# Patient Record
Sex: Female | Born: 1980
Health system: Southern US, Community
[De-identification: ages and names within clinical notes are randomized; demographics above are authoritative.]

## PROBLEM LIST (undated history)

## (undated) ENCOUNTER — Emergency Department (HOSPITAL_COMMUNITY): Admission: EM | Payer: 59 | Source: Home / Self Care

## (undated) DIAGNOSIS — Z8489 Family history of other specified conditions: Secondary | ICD-10-CM

## (undated) DIAGNOSIS — C181 Malignant neoplasm of appendix: Secondary | ICD-10-CM

## (undated) DIAGNOSIS — K219 Gastro-esophageal reflux disease without esophagitis: Secondary | ICD-10-CM

## (undated) DIAGNOSIS — F3281 Premenstrual dysphoric disorder: Secondary | ICD-10-CM

## (undated) DIAGNOSIS — T8859XA Other complications of anesthesia, initial encounter: Secondary | ICD-10-CM

## (undated) DIAGNOSIS — Z9889 Other specified postprocedural states: Secondary | ICD-10-CM

## (undated) DIAGNOSIS — K8020 Calculus of gallbladder without cholecystitis without obstruction: Secondary | ICD-10-CM

## (undated) DIAGNOSIS — E282 Polycystic ovarian syndrome: Secondary | ICD-10-CM

## (undated) DIAGNOSIS — S0300XA Dislocation of jaw, unspecified side, initial encounter: Secondary | ICD-10-CM

## (undated) HISTORY — PX: SHOULDER SURGERY: SHX246

## (undated) HISTORY — PX: ABDOMINOPLASTY: SUR9

## (undated) HISTORY — DX: Calculus of gallbladder without cholecystitis without obstruction: K80.20

## (undated) HISTORY — PX: APPENDECTOMY: SHX54

## (undated) HISTORY — PX: NASAL RECONSTRUCTION: SHX2069

## (undated) HISTORY — PX: CHOLECYSTECTOMY: SHX55

## (undated) HISTORY — DX: Premenstrual dysphoric disorder: F32.81

## (undated) HISTORY — DX: Dislocation of jaw, unspecified side, initial encounter: S03.00XA

## (undated) HISTORY — DX: Malignant neoplasm of appendix: C18.1

---

## 2009-05-01 ENCOUNTER — Inpatient Hospital Stay (HOSPITAL_COMMUNITY): Admission: AD | Admit: 2009-05-01 | Discharge: 2009-05-02 | Payer: Self-pay | Admitting: Pediatrics

## 2009-05-01 ENCOUNTER — Ambulatory Visit: Payer: Self-pay | Admitting: Advanced Practice Midwife

## 2010-05-04 ENCOUNTER — Other Ambulatory Visit (HOSPITAL_COMMUNITY): Payer: BC Managed Care – PPO

## 2010-05-04 ENCOUNTER — Inpatient Hospital Stay (HOSPITAL_COMMUNITY)
Admission: AD | Admit: 2010-05-04 | Discharge: 2010-05-04 | Disposition: A | Discharge status: Home / Self Care | Payer: BC Managed Care – PPO | Source: Ambulatory Visit | Attending: Obstetrics and Gynecology | Admitting: Obstetrics and Gynecology

## 2010-05-04 DIAGNOSIS — O039 Complete or unspecified spontaneous abortion without complication: Secondary | ICD-10-CM | POA: Insufficient documentation

## 2010-05-04 LAB — URINALYSIS, ROUTINE W REFLEX MICROSCOPIC
Bilirubin Urine: NEGATIVE
Ketones, ur: NEGATIVE mg/dL
Leukocytes, UA: NEGATIVE
Nitrite: NEGATIVE
Protein, ur: NEGATIVE mg/dL
Urobilinogen, UA: 0.2 mg/dL (ref 0.0–1.0)
pH: 6.5 (ref 5.0–8.0)

## 2010-05-04 LAB — CBC
MCH: 30.8 pg (ref 26.0–34.0)
RBC: 4.06 MIL/uL (ref 3.87–5.11)
WBC: 10.7 10*3/uL — ABNORMAL HIGH (ref 4.0–10.5)

## 2010-05-04 LAB — POCT PREGNANCY, URINE: Preg Test, Ur: POSITIVE

## 2010-05-04 LAB — WET PREP, GENITAL: Clue Cells Wet Prep HPF POC: NONE SEEN

## 2010-05-08 LAB — GC/CHLAMYDIA PROBE AMP, GENITAL
Chlamydia, DNA Probe: NEGATIVE
GC Probe Amp, Genital: NEGATIVE

## 2010-05-21 ENCOUNTER — Encounter: Payer: BC Managed Care – PPO | Admitting: Obstetrics & Gynecology

## 2010-05-24 DEATH — deceased

## 2010-06-14 LAB — URINALYSIS, ROUTINE W REFLEX MICROSCOPIC
Hgb urine dipstick: NEGATIVE
Ketones, ur: 40 mg/dL — AB
Specific Gravity, Urine: >1.03 — ABNORMAL HIGH (ref 1.005–1.030)

## 2010-06-14 LAB — URINE MICROSCOPIC-ADD ON

## 2010-06-14 LAB — POCT PREGNANCY, URINE: Preg Test, Ur: POSITIVE

## 2012-08-18 ENCOUNTER — Encounter: Payer: Self-pay | Admitting: Cardiovascular Disease

## 2012-08-18 ENCOUNTER — Ambulatory Visit (INDEPENDENT_AMBULATORY_CARE_PROVIDER_SITE_OTHER): Payer: BC Managed Care – PPO | Admitting: Cardiovascular Disease

## 2012-08-18 VITALS — BP 110/82 | HR 63 | Ht 62.0 in | Wt 170.0 lb

## 2012-08-18 DIAGNOSIS — R079 Chest pain, unspecified: Secondary | ICD-10-CM | POA: Insufficient documentation

## 2012-08-18 DIAGNOSIS — E663 Overweight: Secondary | ICD-10-CM | POA: Insufficient documentation

## 2012-08-18 DIAGNOSIS — I498 Other specified cardiac arrhythmias: Secondary | ICD-10-CM

## 2012-08-18 DIAGNOSIS — R001 Bradycardia, unspecified: Secondary | ICD-10-CM | POA: Insufficient documentation

## 2012-08-18 NOTE — Patient Instructions (Addendum)
Your physician has requested that you have a stress echocardiogram. For further information please visit [email or web address] Please follow instruction sheet as given.  Your physician recommends that you continue on your current medications as directed. Please refer to the Current Medication list given to you today.  Your physician recommends that you schedule a follow-up appointment in: as needed. We will contact you with your test results

## 2012-08-18 NOTE — Assessment & Plan Note (Signed)
Atypical Normal exam and ECG F/U stress echo

## 2012-08-18 NOTE — Assessment & Plan Note (Signed)
No clinically significant low HR Normla ECG  Should have normal HR response on ETT

## 2012-08-18 NOTE — Assessment & Plan Note (Signed)
Encouraged her not to use diet pills Can do a lot with diet and exercise.  Not heavy enough for bariatric considerations

## 2012-08-18 NOTE — Progress Notes (Signed)
Patient ID: Mallory Byrd, female   DOB: 07-13-1980, 32 y.o.   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  Was having an ECG to use diet pills.  Told she was bradycardic.  Reviewed and normal ECG SR rate 51 no AV block.  She has been overweight for a long time Poor high carb diet and sedentary outside of work.  Chronic sharp sternal / chostrocondral pain.  Has been on diet pills in past with no issues She cannot recall name. No palpitations syncope lightheadedness. No family history of sudden death.  ROS: Denies fever, malais, weight loss, blurry vision, decreased visual acuity, cough, sputum, SOB, hemoptysis, pleuritic pain, palpitaitons, heartburn, abdominal pain, melena, lower extremity edema, claudication, or rash.  All other systems reviewed and negative   General: Affect appropriate Healthy:  appears stated age HEENT: normal Neck supple with no adenopathy JVP normal no bruits no thyromegaly Lungs clear with no wheezing and good diaphragmatic motion Heart:  S1/S2 no murmur,rub, gallop or click PMI normal Abdomen: benighn, BS positve, no tenderness, no AAA no bruit.  No HSM or HJR Distal pulses intact with no bruits No edema Neuro non-focal Skin warm and dry No muscular weakness  Medications No current outpatient prescriptions on file.   No current facility-administered medications for this visit.    Allergies Review of patient's allergies indicates not on file.  Family History: No family history on file.  Social History: History   Social History  . Marital Status: Married    Spouse Name: N/A    Number of Children: N/A  . Years of Education: N/A   Occupational History  . Not on file.   Social History Main Topics  . Smoking status: Not on file  . Smokeless tobacco: Not on file  . Alcohol Use: Not on file  . Drug Use: Not on file  . Sexually Active: Not on file   Other Topics Concern  . Not on file   Social History Narrative  . No narrative on file     Electrocardiogram:  SR rate 63 normal ECG   Assessment and Plan

## 2012-09-11 ENCOUNTER — Other Ambulatory Visit (HOSPITAL_COMMUNITY): Payer: BC Managed Care – PPO

## 2012-09-15 ENCOUNTER — Encounter: Payer: Self-pay | Admitting: Cardiovascular Disease

## 2015-03-26 HISTORY — PX: RHINOPLASTY: SUR1284

## 2016-01-26 ENCOUNTER — Ambulatory Visit (INDEPENDENT_AMBULATORY_CARE_PROVIDER_SITE_OTHER): Payer: Self-pay | Admitting: Orthopedic Surgery

## 2017-03-05 DIAGNOSIS — Z9889 Other specified postprocedural states: Secondary | ICD-10-CM | POA: Insufficient documentation

## 2017-03-25 DIAGNOSIS — C181 Malignant neoplasm of appendix: Secondary | ICD-10-CM

## 2017-03-25 DIAGNOSIS — K8020 Calculus of gallbladder without cholecystitis without obstruction: Secondary | ICD-10-CM

## 2017-03-25 HISTORY — DX: Calculus of gallbladder without cholecystitis without obstruction: K80.20

## 2017-03-25 HISTORY — PX: APPENDECTOMY: SHX54

## 2017-03-25 HISTORY — DX: Malignant neoplasm of appendix: C18.1

## 2017-03-25 HISTORY — PX: CHOLECYSTECTOMY: SHX55

## 2018-11-10 DIAGNOSIS — E282 Polycystic ovarian syndrome: Secondary | ICD-10-CM | POA: Insufficient documentation

## 2020-03-25 DIAGNOSIS — R200 Anesthesia of skin: Secondary | ICD-10-CM

## 2020-03-25 HISTORY — PX: ABDOMINOPLASTY: SUR9

## 2020-03-25 HISTORY — DX: Anesthesia of skin: R20.0

## 2021-02-18 ENCOUNTER — Other Ambulatory Visit: Payer: Self-pay

## 2021-02-18 ENCOUNTER — Emergency Department (HOSPITAL_COMMUNITY): Payer: 59

## 2021-02-18 ENCOUNTER — Emergency Department (HOSPITAL_BASED_OUTPATIENT_CLINIC_OR_DEPARTMENT_OTHER)
Admission: EM | Admit: 2021-02-18 | Discharge: 2021-02-18 | Disposition: A | Discharge status: Home / Self Care | Payer: 59 | Attending: Emergency Medicine | Admitting: Emergency Medicine

## 2021-02-18 ENCOUNTER — Encounter (HOSPITAL_BASED_OUTPATIENT_CLINIC_OR_DEPARTMENT_OTHER): Payer: Self-pay | Admitting: Emergency Medicine

## 2021-02-18 ENCOUNTER — Emergency Department (HOSPITAL_BASED_OUTPATIENT_CLINIC_OR_DEPARTMENT_OTHER): Payer: 59

## 2021-02-18 DIAGNOSIS — R202 Paresthesia of skin: Secondary | ICD-10-CM | POA: Diagnosis not present

## 2021-02-18 DIAGNOSIS — R200 Anesthesia of skin: Secondary | ICD-10-CM

## 2021-02-18 LAB — BASIC METABOLIC PANEL
Anion gap: 8 (ref 5–15)
BUN: 17 mg/dL (ref 6–20)
CO2: 24 mmol/L (ref 22–32)
Calcium: 9.2 mg/dL (ref 8.9–10.3)
Chloride: 105 mmol/L (ref 98–111)
Creatinine, Ser: 0.96 mg/dL (ref 0.44–1.00)
GFR, Estimated: >60 mL/min (ref >60)
Glucose, Bld: 92 mg/dL (ref 70–99)
Potassium: 3.7 mmol/L (ref 3.5–5.1)
Sodium: 137 mmol/L (ref 135–145)

## 2021-02-18 LAB — CBC
HCT: 41.1 % (ref 36.0–46.0)
Hemoglobin: 13.3 g/dL (ref 12.0–15.0)
MCH: 31.3 pg (ref 26.0–34.0)
MCHC: 32.4 g/dL (ref 30.0–36.0)
MCV: 96.7 fL (ref 80.0–100.0)
Platelets: 327 10*3/uL (ref 150–400)
RBC: 4.25 MIL/uL (ref 3.87–5.11)
RDW: 13 % (ref 11.5–15.5)
WBC: 13.4 10*3/uL — ABNORMAL HIGH (ref 4.0–10.5)
nRBC: 0 % (ref 0.0–0.2)

## 2021-02-18 LAB — PREGNANCY, URINE: Preg Test, Ur: NEGATIVE

## 2021-02-18 LAB — MAGNESIUM: Magnesium: 2 mg/dL (ref 1.7–2.4)

## 2021-02-18 NOTE — ED Notes (Signed)
MD recommendation for MRI.  Plan pt. To go to [facility] by private vehicle for testing and follow up in [facility] ED.

## 2021-02-18 NOTE — ED Provider Notes (Signed)
Emergency Department Provider Note   I have reviewed the triage vital signs and the nursing notes.   HISTORY  Chief Complaint Numbness   HPI Mallory Byrd is a 40 y.o. female with past history reviewed below presents emergency department for evaluation of tingling and subjective decrease sensation to the left face.  Symptoms have been progressing over the past 5 days.  She states initially it was mainly her upper lip just on the left side that is since involved her entire left face.  She is not having headache.  She is not having appreciable weakness.  No symptoms progressing into the arm or leg.  No fevers or chills.  No chest discomfort.  No prior history of the same.  She takes some over-the-counter vitamin supplements but no prescription medications. No eye pain or vision disturbance.   History reviewed. No pertinent past medical history.  There are no problems to display for this patient.   Past Surgical History:  Procedure Laterality Date   APPENDECTOMY     CHOLECYSTECTOMY      Allergies Patient has no known allergies.  History reviewed. No pertinent family history.  Social History    Review of Systems  Constitutional: No fever/chills Eyes: No visual changes. ENT: No sore throat. Cardiovascular: Denies chest pain. Respiratory: Denies shortness of breath. Gastrointestinal: No abdominal pain.  No nausea, no vomiting.  No diarrhea.  No constipation. Genitourinary: Negative for dysuria. Musculoskeletal: Negative for back pain. Skin: Negative for rash. Neurological: Negative for headaches, focal weakness. Positive left face numbness.   10-point ROS otherwise negative.  ____________________________________________   PHYSICAL EXAM:  VITAL SIGNS: ED Triage Vitals  Enc Vitals Group     BP 02/18/21 1423 134/82     Pulse Rate 02/18/21 1423 82     Resp 02/18/21 1423 18     Temp 02/18/21 1423 97.8 F (36.6 C)     Temp Source 02/18/21 1423 Oral     SpO2  02/18/21 1423 99 %     Weight 02/18/21 1423 170 lb (77.1 kg)     Height 02/18/21 1423 5\' 2"  (1.575 m)   Constitutional: Alert and oriented. Well appearing and in no acute distress. Eyes: Conjunctivae are normal. PERRL. EOMI. Head: Atraumatic. Nose: No congestion/rhinnorhea. Mouth/Throat: Mucous membranes are moist.   Neck: No stridor.   Cardiovascular: Normal rate, regular rhythm. Good peripheral circulation. Grossly normal heart sounds.   Respiratory: Normal respiratory effort.  No retractions. Lungs CTAB. Gastrointestinal: Soft and nontender. No distention.  Musculoskeletal: No lower extremity tenderness nor edema. No gross deformities of extremities. Neurologic:  Normal speech and language.  Decree sensation to light touch over the left face.  No weakness appreciated.  Somewhat decreased sensation in the left hand as well.  Normal strength.  No pronator drift.  Normal strength and sensation in the bilateral lower extremities. Skin:  Skin is warm, dry and intact. No rash noted.   ____________________________________________   LABS (all labs ordered are listed, but only abnormal results are displayed)  Labs Reviewed  CBC - Abnormal; Notable for the following components:      Result Value   WBC 13.4 (*)    All other components within normal limits  MAGNESIUM  BASIC METABOLIC PANEL  PREGNANCY, URINE   ____________________________________________  RADIOLOGY  CT HEAD WO CONTRAST (5MM)  Result Date: 02/18/2021 CLINICAL DATA:  LEFT facial numbness, tingling and decreased sensation LEFT face for 5 days, question stroke EXAM: CT HEAD WITHOUT CONTRAST TECHNIQUE: Contiguous axial images  were obtained from the base of the skull through the vertex without intravenous contrast. COMPARISON:  None FINDINGS: Brain: Normal ventricular morphology. No midline shift or mass effect. Normal appearance of brain parenchyma. No intracranial hemorrhage, mass lesion, evidence of acute infarction, or  extra-axial fluid collection. Vascular: No hyperdense vessels Skull: Intact Sinuses/Orbits: Clear Other: N/A IMPRESSION: Normal exam. Electronically Signed   By: Lavonia Dana M.D.   On: 02/18/2021 16:08    ____________________________________________   PROCEDURES  Procedure(s) performed:   Procedures  None  ____________________________________________   INITIAL IMPRESSION / ASSESSMENT AND PLAN / ED COURSE  Pertinent labs & imaging results that were available during my care of the patient were reviewed by me and considered in my medical decision making (see chart for details).   Patient presents emergency department left face numbness.  No headache to suspect complex migraine.  Patient having some slight subjective sensory change in the left hand as well.  My suspicion for stroke is low but with focal finding on exam do plan for CT imaging and lab work including electrolytes. Differential includes complex migraine, CVA, MS, and peripheral neuropathy.   05:00 PM  CT imaging unremarkable.  Plan for MRI to further evaluate for stroke although patient is low risk overall.  I have arranged for transfer to the [facility] emergency department.  Patient has someone here who can drive her to the [facility] emergency department.  Dr. Melina Copa is the accepting EDP.   ____________________________________________  FINAL CLINICAL IMPRESSION(S) / ED DIAGNOSES  Final diagnoses:  Numbness      Note:  This document was prepared using Dragon voice recognition software and may include unintentional dictation errors.  Nanda Quinton, MD, [facility]    Dameka Younker, Wonda Olds, MD 02/19/21 [phone]

## 2021-02-18 NOTE — ED Notes (Signed)
Phone Report Given to [place] RN at [facility]

## 2021-02-18 NOTE — ED Notes (Signed)
Pt called for vitals several times no answer.

## 2021-02-18 NOTE — ED Notes (Signed)
MRI notified of patient arrival from [facility]. Patient alert and oriented, awaiting MRI in lobby

## 2021-02-18 NOTE — Discharge Instructions (Signed)
You were seen in the emergency department today.  I have arranged for you to go to [facility] to have an MRI to further evaluate your numbness.  Please go directly to the [facility] emergency department.  Tell them that you are a transfer from [place] and they will arrange for your MRI to be completed.

## 2021-02-18 NOTE — ED Notes (Signed)
Called for vitals x5

## 2021-02-18 NOTE — ED Triage Notes (Addendum)
Pt presents to ED POV. Pt c/o tingling and decreased sensation on L side of face x5d. Otherwise neuro intact.

## 2021-02-18 NOTE — ED Triage Notes (Signed)
Attempted lab draw, unable to obtain. Pt states she will pass if she gets stuck again and will need to lay down to have labs drawn, will try again once patient in room.

## 2021-02-19 ENCOUNTER — Telehealth (HOSPITAL_BASED_OUTPATIENT_CLINIC_OR_DEPARTMENT_OTHER): Payer: Self-pay | Admitting: Emergency Medicine

## 2021-02-19 ENCOUNTER — Ambulatory Visit: Payer: Self-pay

## 2021-02-19 NOTE — Telephone Encounter (Signed)
Patient evaluated for numbness to the face.  She was seen at the [facility] emergency department but will need a referral for neurology follow-up.

## 2021-02-19 NOTE — Telephone Encounter (Signed)
Pt.'s mother calling to get MRI results. Instructed to call her PCP. States she wants the ED department. Transferred caller.     Answer Assessment - Initial Assessment Questions 1. REASON FOR CALL or QUESTION: "What is your reason for calling today?" or "How can I best help you?" or "What question do you have that I can help answer?"     Imaging results 2. CALLER: Document the source of call. (e.g., laboratory, patient).     Patient  Protocols used: PCP Call - No Triage-A-AH

## 2021-02-21 ENCOUNTER — Other Ambulatory Visit: Payer: Self-pay

## 2021-02-21 ENCOUNTER — Encounter: Payer: Self-pay | Admitting: Diagnostic Neuroimaging

## 2021-02-21 ENCOUNTER — Ambulatory Visit (INDEPENDENT_AMBULATORY_CARE_PROVIDER_SITE_OTHER): Payer: 59 | Admitting: Diagnostic Neuroimaging

## 2021-02-21 VITALS — BP 126/92 | HR 80 | Wt 175.0 lb

## 2021-02-21 DIAGNOSIS — E538 Deficiency of other specified B group vitamins: Secondary | ICD-10-CM | POA: Diagnosis not present

## 2021-02-21 DIAGNOSIS — R200 Anesthesia of skin: Secondary | ICD-10-CM

## 2021-02-21 DIAGNOSIS — G510 Bell's palsy: Secondary | ICD-10-CM

## 2021-02-21 NOTE — Progress Notes (Signed)
[facility]  PATIENT: Mallory Byrd DOB: December 07, 1980  REFERRING CLINICIAN: Long, Wonda Olds, MD HISTORY FROM: patient  REASON FOR VISIT: new consult    HISTORICAL  CHIEF COMPLAINT:  Chief Complaint  Patient presents with   New Patient (Initial Visit)    Rm 7 with mom and boyfriend here for consult on worsening numbness/tingling on the left side of the face.Pt reports 2 weeks ago symptoms started went to [facility] ED, MRI completed. Pt reports numbness is still present on the left side of her face. HX of TMJ    HISTORY OF PRESENT ILLNESS:   40 year old female here for evaluation of left facial numbness.  Symptoms started about 2 weeks ago with gradual onset of onset of numbness in left upper lip which progressed to her left forehead and left scalp region over the course of 24 hours.  Symptoms have been persistent since that time.  Patient had a CT and MRI studies which showed nonspecific T2 hyperintensities.  Studies were performed without IV contrast.  MRI cervical spine unremarkable.  No prodromal accidents injuries traumas or infections.  No prior similar symptoms.  During ER evaluation patient had some subjective numbness in left hand which has since resolved.  Patient has remote history of migraine headaches as a child, previously treated with medications, currently under control.  No recent recurrence, change or increase in headaches.  Patient has history of B12 deficiency for past 2 years, but has not been on replacement therapy.  Family history of B12 deficiency and mother and maternal relatives.    REVIEW OF SYSTEMS: Full 14 system review of systems performed and negative with exception of: as per HPI.  ALLERGIES: No Known Allergies  HOME MEDICATIONS: Outpatient Medications Prior to Visit  Medication Sig Dispense Refill   Diclofenac Sodium CR 100 MG 24 hr tablet Take 100 mg by mouth daily.     No facility-administered medications prior to visit.     PAST MEDICAL HISTORY: Past Medical History:  Diagnosis Date   TMJ (dislocation of temporomandibular joint)     PAST SURGICAL HISTORY: Past Surgical History:  Procedure Laterality Date   APPENDECTOMY     CHOLECYSTECTOMY      FAMILY HISTORY: Family History  Problem Relation Age of Onset   Healthy Mother     SOCIAL HISTORY: Social History   Socioeconomic History   Marital status: Single    Spouse name: Not on file   Number of children: Not on file   Years of education: Not on file   Highest education level: Associate degree: academic program  Occupational History   Not on file  Tobacco Use   Smoking status: Never   Smokeless tobacco: Never  Substance and Sexual Activity   Alcohol use: Yes    Comment: Rare   Drug use: Never   Sexual activity: Not on file  Other Topics Concern   Not on file  Social History Narrative   Left handed   Caffeine- 2-3 cups of coffee in the morning    Social Determinants of Health   Financial Resource Strain: Not on file  Food Insecurity: Not on file  Transportation Needs: Not on file  Physical Activity: Not on file  Stress: Not on file  Social Connections: Not on file  Intimate Partner Violence: Not on file     PHYSICAL EXAM  GENERAL EXAM/CONSTITUTIONAL: Vitals:  Vitals:   02/21/21 0901  BP: (!) 126/92  Pulse: 80  SpO2: 99%  Weight: 175 lb (79.4 kg)  Body mass index is 32.01 kg/m. Wt Readings from Last 3 Encounters:  02/21/21 175 lb (79.4 kg)  02/18/21 170 lb (77.1 kg)  08/18/12 170 lb (77.1 kg)   Patient is in no distress; well developed, nourished and groomed; neck is supple  CARDIOVASCULAR: Examination of carotid arteries is normal; no carotid bruits Regular rate and rhythm, no murmurs Examination of peripheral vascular system by observation and palpation is normal  EYES: Ophthalmoscopic exam of optic discs and posterior segments is normal; no papilledema or hemorrhages No results  found.  MUSCULOSKELETAL: Gait, strength, tone, movements noted in Neurologic exam below  NEUROLOGIC: MENTAL STATUS:  No flowsheet data found. awake, alert, oriented to person, place and time recent and remote memory intact normal attention and concentration language fluent, comprehension intact, naming intact fund of knowledge appropriate  CRANIAL NERVE:  2nd - no papilledema on fundoscopic exam 2nd, 3rd, 4th, 6th - pupils equal and reactive to light, visual fields full to confrontation, extraocular muscles intact, no nystagmus 5th - facial sensation --> DECR IN LEFT V1, V2 REGIONS 7th - facial strength symmetric 8th - hearing intact 9th - palate elevates symmetrically, uvula midline 11th - shoulder shrug symmetric 12th - tongue protrusion midline  MOTOR:  normal bulk and tone, full strength in the BUE, BLE  SENSORY:  normal and symmetric to light touch, temperature, vibration  COORDINATION:  finger-nose-finger, fine finger movements normal  REFLEXES:  deep tendon reflexes present and symmetric  GAIT/STATION:  narrow based gait     DIAGNOSTIC DATA (LABS, IMAGING, TESTING) - I reviewed patient records, labs, notes, testing and imaging myself where available.  Lab Results  Component Value Date   WBC 13.4 (H) 02/18/2021   HGB 13.3 02/18/2021   HCT 41.1 02/18/2021   MCV 96.7 02/18/2021   PLT 327 02/18/2021      Component Value Date/Time   NA 137 02/18/2021 1630   K 3.7 02/18/2021 1630   CL 105 02/18/2021 1630   CO2 24 02/18/2021 1630   GLUCOSE 92 02/18/2021 1630   BUN 17 02/18/2021 1630   CREATININE 0.96 02/18/2021 1630   CALCIUM 9.2 02/18/2021 1630   GFRNONAA >60 02/18/2021 1630   No results found for: CHOL, HDL, LDLCALC, LDLDIRECT, TRIG, CHOLHDL No results found for: HGBA1C No results found for: VITAMINB12 No results found for: TSH   02/18/21 MRI brain [I reviewed images myself and agree with interpretation. There is a small lesion at left dorsal  root entry zone of CN5, which could correlate with symptoms.  Incidental right parietal benign cyst versus prominent Virchow-Robin space. -VRP]  1. No evidence of acute or subacute infarction. 2. Punctate foci of T2 hyperintense signal in the cerebral and cerebellar hemispheres, which are nonspecific but can be seen in the setting of chronic small vessel ischemic changes and chronic migraines, although these tend to be limited to the cerebral hemispheres. These can also be seen in the setting of prior traumatic brain injury, as well as with demyelinating disease, such as multiple sclerosis, although these are not in a pattern typical of multiple sclerosis. Correlate with symptoms and history.    ASSESSMENT AND PLAN  40 y.o. year old female here with:   Dx:  1. Left facial numbness   2. B12 deficiency   3. Bell's palsy       PLAN:  LEFT FACIAL NUMBNESS (onset ~Feb 06, 2021) - ddx: multiple sclerosis, migraine phenomenon, B12 deficiency; subtle lesion noted in left pons dorsal root entry zone of CN5,  which could be symptomatic - repeat MRI brain (with/without contrast) - check labs - start B12 replacement (orally; then consider injections with PCP) - may consider lumbar puncture - may consider empiric steroids (prednisone vs solumedrol) after repeat scan   Orders Placed This Encounter  Procedures   MR BRAIN W WO CONTRAST   Vitamin B12   Hemoglobin A1c   TSH   Intrinsic Factor Antibodies   Anti-parietal antibody   ANA,IFA RA Diag Pnl w/rflx Tit/Patn   Return in about 3 months (around 05/22/2021) for pending test results.    Penni Bombard, MD 98/47/3085, 6:94 AM Certified in Neurology, Neurophysiology and Neuroimaging  [facility] [address], [place], [place] [address] [phone]

## 2021-02-21 NOTE — Patient Instructions (Signed)
-   check MRI and labs  - start B12 replacement 1055mcg daily (consider injections via PCP)

## 2021-02-24 LAB — HEMOGLOBIN A1C
Est. average glucose Bld gHb Est-mCnc: 108 mg/dL
Hgb A1c MFr Bld: 5.4 % (ref 4.8–5.6)

## 2021-02-24 LAB — INTRINSIC FACTOR ANTIBODIES: Intrinsic Factor Abs, Serum: 1 AU/mL (ref 0.0–1.1)

## 2021-02-24 LAB — ANTI-PARIETAL ANTIBODY: Parietal Cell Ab: 1.2 Units (ref 0.0–20.0)

## 2021-02-24 LAB — ANA,IFA RA DIAG PNL W/RFLX TIT/PATN
ANA Titer 1: NEGATIVE
Cyclic Citrullin Peptide Ab: 3 units (ref 0–19)
Rheumatoid fact SerPl-aCnc: <10 IU/mL (ref <14.0)

## 2021-02-24 LAB — VITAMIN B12: Vitamin B-12: 393 pg/mL (ref 232–1245)

## 2021-02-24 LAB — TSH: TSH: 1.51 u[IU]/mL (ref 0.450–4.500)

## 2021-02-27 ENCOUNTER — Telehealth: Payer: Self-pay | Admitting: Diagnostic Neuroimaging

## 2021-02-27 NOTE — Telephone Encounter (Signed)
LVM for pt to call back to schedule  [facility] auth: [id number] (exp. 02/27/21 to 04/13/21)

## 2021-02-27 NOTE — Telephone Encounter (Signed)
[employer] pending faxed notes 

## 2021-02-28 ENCOUNTER — Telehealth: Payer: Self-pay

## 2021-02-28 NOTE — Telephone Encounter (Signed)
X2 lvm

## 2021-02-28 NOTE — Telephone Encounter (Signed)
-----   Message from Penni Bombard, MD sent at 02/27/2021  6:16 PM EST ----- Normal labs. Please call patient. -VRP

## 2021-05-28 ENCOUNTER — Encounter: Payer: Self-pay | Admitting: Diagnostic Neuroimaging

## 2021-05-28 ENCOUNTER — Ambulatory Visit: Payer: 59 | Admitting: Diagnostic Neuroimaging

## 2021-07-20 DIAGNOSIS — E669 Obesity, unspecified: Secondary | ICD-10-CM | POA: Insufficient documentation

## 2021-07-20 DIAGNOSIS — C7A020 Malignant carcinoid tumor of the appendix: Secondary | ICD-10-CM | POA: Insufficient documentation

## 2022-02-19 ENCOUNTER — Encounter: Payer: Self-pay | Admitting: Obstetrics and Gynecology

## 2022-02-21 ENCOUNTER — Ambulatory Visit (INDEPENDENT_AMBULATORY_CARE_PROVIDER_SITE_OTHER): Payer: 59 | Admitting: Obstetrics and Gynecology

## 2022-02-21 ENCOUNTER — Encounter: Payer: Self-pay | Admitting: Obstetrics and Gynecology

## 2022-02-21 VITALS — BP 118/88 | HR 86 | Ht 62.0 in | Wt 181.0 lb

## 2022-02-21 DIAGNOSIS — N920 Excessive and frequent menstruation with regular cycle: Secondary | ICD-10-CM

## 2022-02-21 NOTE — Progress Notes (Signed)
GYNECOLOGY OFFICE VISIT NOTE  History:   Mallory Byrd is a 41 y.o. [id number] here today for HVB. Cycles very regular. No longer wishes to TTC. Did work up in 07/2020 but she and partner do not wish to continue to try. She would surgery to help with her periods.   Periods last 4 days and are very heavy and painful. Bled through tampons and pads. Uses menstrual cup - largest available and still sometimes bleeds through.   Had 2 SVDs.    She has two sisters who both had heavy periods. They had ablations. She is open to any options but doesn't want anything hormonal. 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    Past Medical History:  Diagnosis Date   Cancer of appendix ([place])    Gallstones    PMDD (premenstrual dysphoric disorder)    TMJ (dislocation of temporomandibular joint)     Past Surgical History:  Procedure Laterality Date   ABDOMINOPLASTY     APPENDECTOMY     CHOLECYSTECTOMY      The following portions of the patient's history were reviewed and updated as appropriate: allergies, current medications, past family history, past medical history, past social history, past surgical history and problem list.   Health Maintenance:   Normal pap and negative HRHPV on 11/18/19.   Review of Systems:  Pertinent items noted in HPI and remainder of comprehensive ROS otherwise negative.  Physical Exam:  BP 118/88   Pulse 86   Ht '5\' 2"'$  (1.575 m)   Wt 181 lb (82.1 kg)   LMP 02/07/2022   BMI 33.11 kg/m  CONSTITUTIONAL: Well-developed, well-nourished female in no acute distress.  HEENT:  Normocephalic, atraumatic. External right and left ear normal. No scleral icterus.  NECK: Normal range of motion, supple, no masses noted on observation SKIN: No rash noted. Not diaphoretic. No erythema. No pallor. MUSCULOSKELETAL: Normal range of motion. No edema noted. NEUROLOGIC: Alert and oriented to person, place, and time. Normal muscle tone coordination. No cranial  nerve deficit noted. PSYCHIATRIC: Normal mood and affect. Normal behavior. Normal judgment and thought content.  CARDIOVASCULAR: Normal heart rate noted RESPIRATORY: Effort and breath sounds normal, no problems with respiration noted ABDOMEN: No masses noted. No other overt distention noted.    PELVIC: Deferred  Labs and Imaging No results found for this or any previous visit (from the past 168 hour(s)). No results found.  Assessment and Plan:   1. Menorrhagia with regular cycle - Diagnosis: Dysmenorrhea with menorrhagia vs adenomyosis. Will check [place] to confirm good candidate for [facility] but no history of fibroids.  - Planned surgery: TVH, bilateral salpingectomy.  - Risks of surgery include but are not limited to:  Bleeding - Can bleed enough to need transfusion or need for additional surgeries I.e. conversation to open surgery.  Infection - The vagina can develop cuff infection Injury to surrounding organs/tissues (i.e. bowel/bladder/ureters) - discussed how each of these is managed I.e. bladder injury requires catheter for 10-14 days after surgery Need for additional procedures - Would be specific to a complication or injury Wound complications - No specific wound unless converted to open surgery or laparoscopic Hospital re-admission - In the event of a delayed complication being recognized I.e. ureteral injury discussed Conversion to open surgery - reviewed some complications may necessitate or it may be the only way to complete the surgery.   VTE - Discussed risk of blood clots following delivery - We discussed alternatives to hysterectomy including birth  control options that will work better to regulate bleeding, I.e. IUD, endometrial ablation. She desires definitive management.  - We discussed postop restrictions, precautions and expectations - Preop testing needed:  CBC/T&S - All questions answered - US PELVIC COMPLETE WITH TRANSVAGINAL; Future    Routine preventative health  maintenance measures emphasized. Please refer to After Visit Summary for other counseling recommendations.   No follow-ups on file.  Radene Gunning, MD, [place] [facility] [place]

## 2022-02-25 ENCOUNTER — Ambulatory Visit: Payer: 59

## 2022-02-27 ENCOUNTER — Ambulatory Visit (INDEPENDENT_AMBULATORY_CARE_PROVIDER_SITE_OTHER): Payer: 59

## 2022-02-27 DIAGNOSIS — N920 Excessive and frequent menstruation with regular cycle: Secondary | ICD-10-CM

## 2022-04-25 ENCOUNTER — Encounter: Payer: Self-pay | Admitting: Obstetrics and Gynecology

## 2022-05-07 ENCOUNTER — Ambulatory Visit (HOSPITAL_COMMUNITY): Admission: RE | Admit: 2022-05-07 | Payer: 59 | Source: Home / Self Care | Admitting: Obstetrics and Gynecology

## 2022-05-07 ENCOUNTER — Encounter (HOSPITAL_COMMUNITY): Admission: RE | Payer: Self-pay | Source: Home / Self Care

## 2022-05-07 DIAGNOSIS — Z8742 Personal history of other diseases of the female genital tract: Secondary | ICD-10-CM

## 2022-05-07 SURGERY — HYSTERECTOMY, VAGINAL
Anesthesia: Choice | Laterality: Bilateral

## 2022-05-16 ENCOUNTER — Institutional Professional Consult (permissible substitution): Payer: 59 | Admitting: Obstetrics and Gynecology

## 2022-06-05 ENCOUNTER — Telehealth: Payer: Self-pay | Admitting: *Deleted

## 2022-06-05 NOTE — Telephone Encounter (Signed)
Left patient a message to call and schedule May Post Op appointment with Dr. Mikel Cella for June surgery.

## 2022-07-09 IMAGING — CT CT HEAD W/O CM
3 series · 15 of 47 positions shown, 18 images · non-contrast
Comparison: None

CLINICAL DATA: LEFT facial numbness, tingling and decreased
sensation LEFT face for 5 days, question stroke

EXAM:
CT HEAD WITHOUT CONTRAST
TECHNIQUE: Contiguous axial images were obtained from the base of the skull
through the vertex without intravenous contrast.

[Series 2: head wo · axial · 0.44mm/px · z∈[-292,-167]mm · 9 of 31 slices shown, 12 images]
[im 3/31  brain]
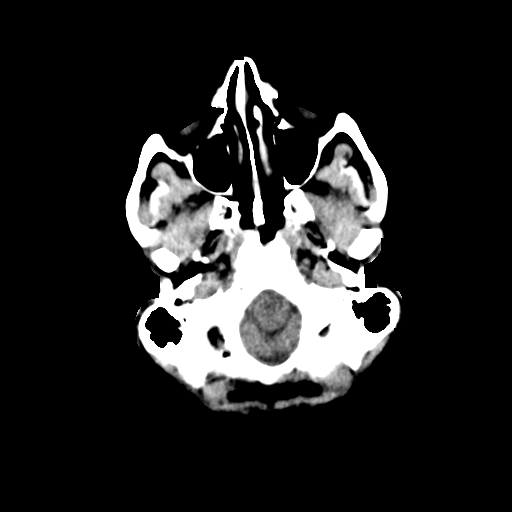
[im 3/31  bone]
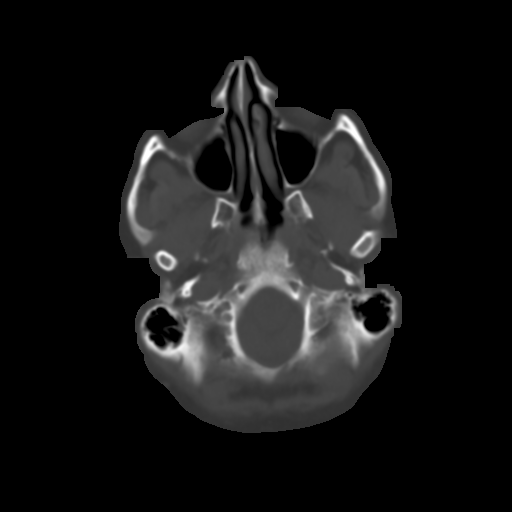
[im 6/31  brain]
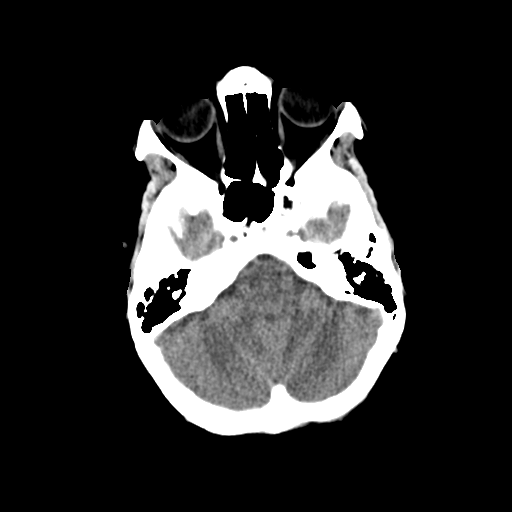
[im 9/31  brain]
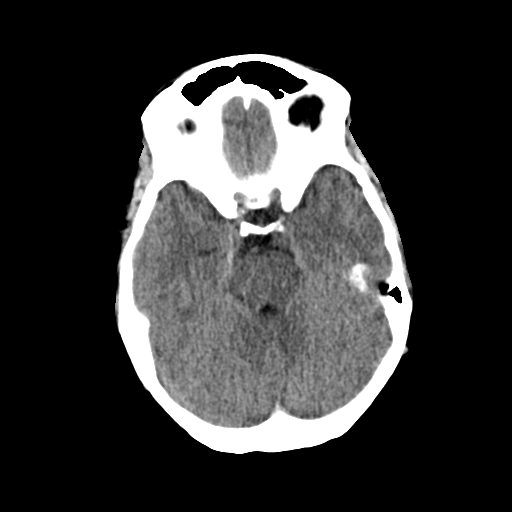
[im 12/31  brain]
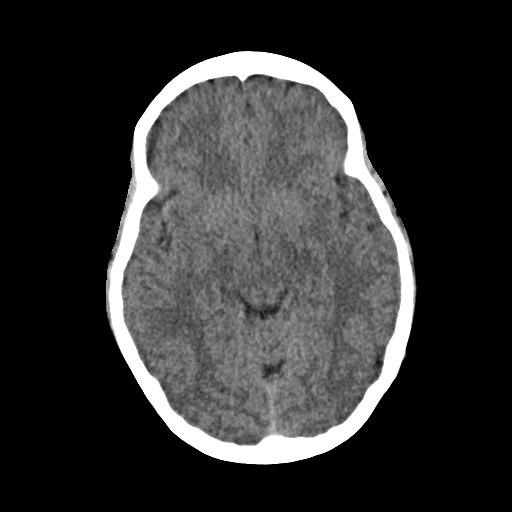
[im 16/31  brain]
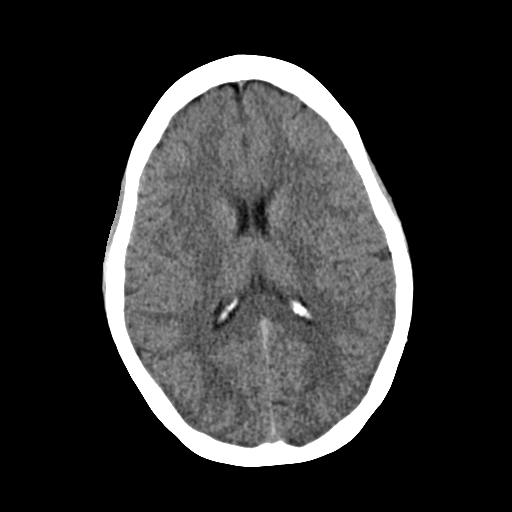
[im 16/31  bone]
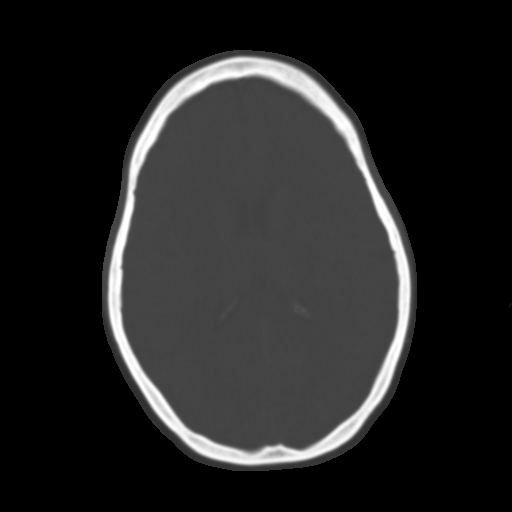
[im 19/31  brain]
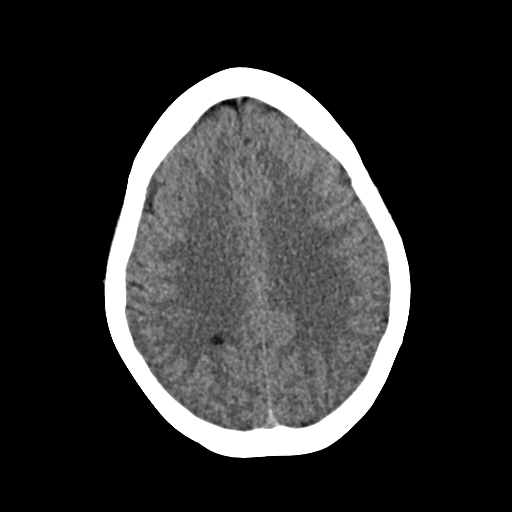
[im 22/31  brain]
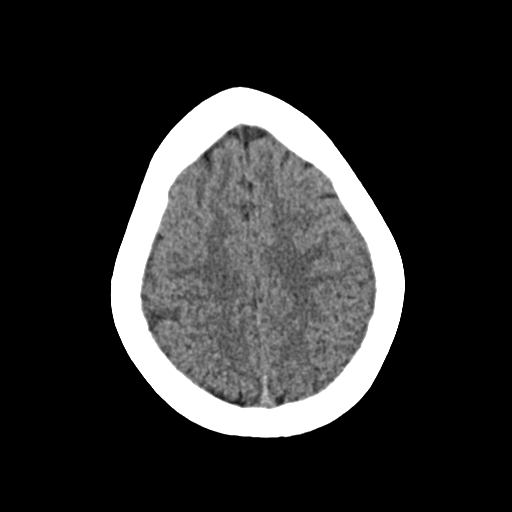
[im 25/31  brain]
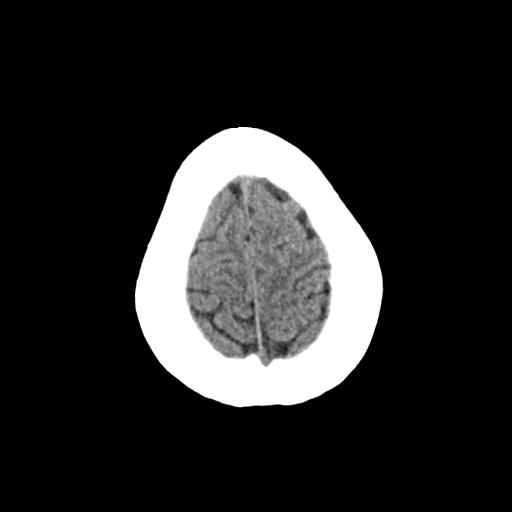
[im 28/31  brain]
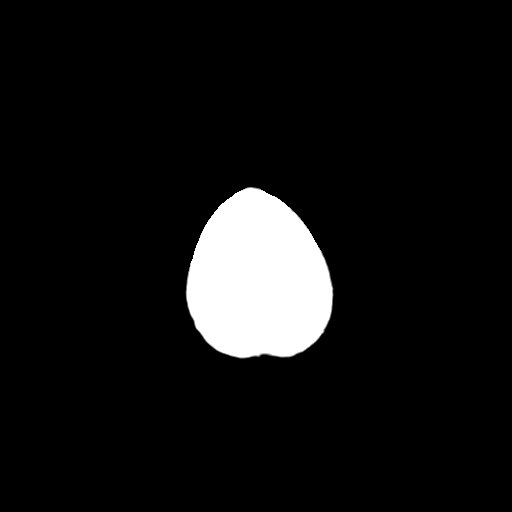
[im 28/31  bone]
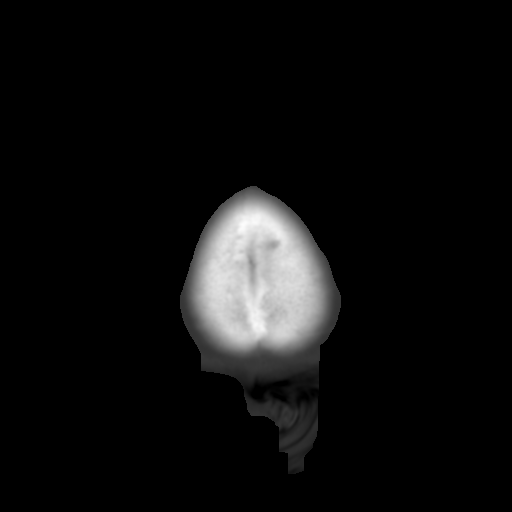

[Series 4: coronal soft · coronal · 0.30mm/px · 3 of 67 slices shown]
[im 23/67  brain]
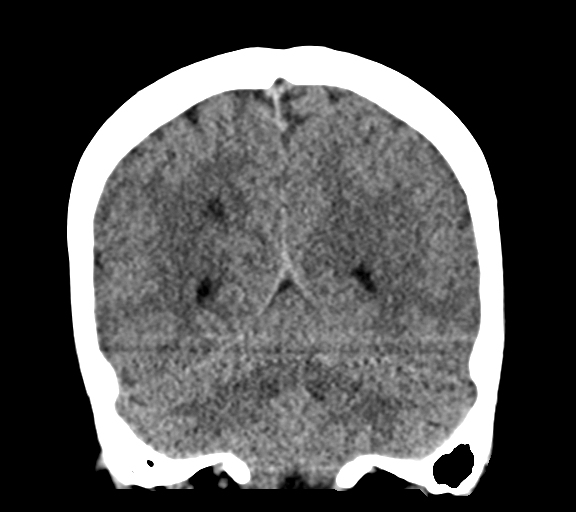
[im 30/67  brain]
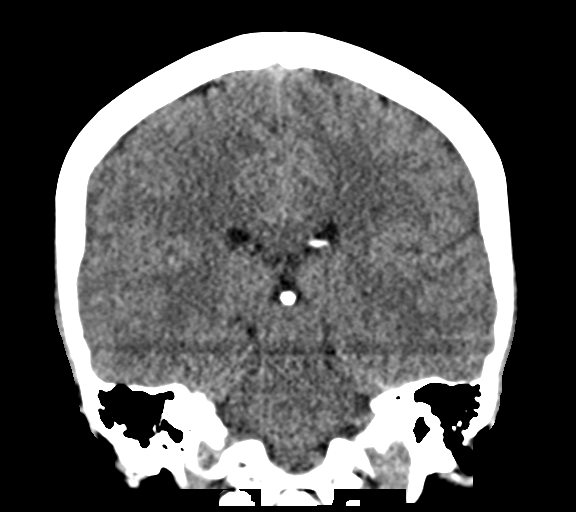
[im 37/67  brain]
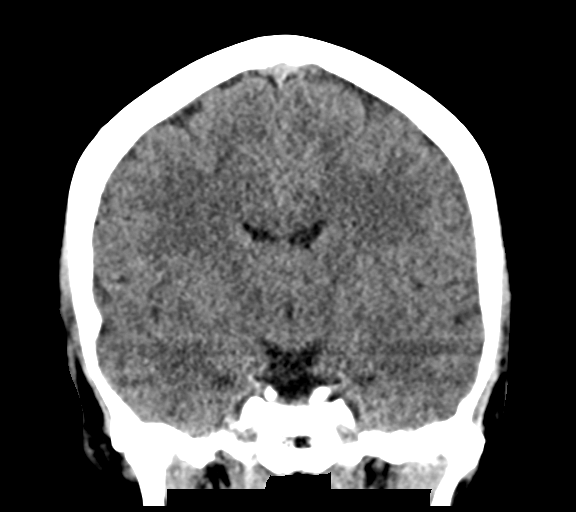

[Series 5: sag soft · sagittal · 0.29mm/px · 3 of 52 slices shown]
[im 18/52  brain]
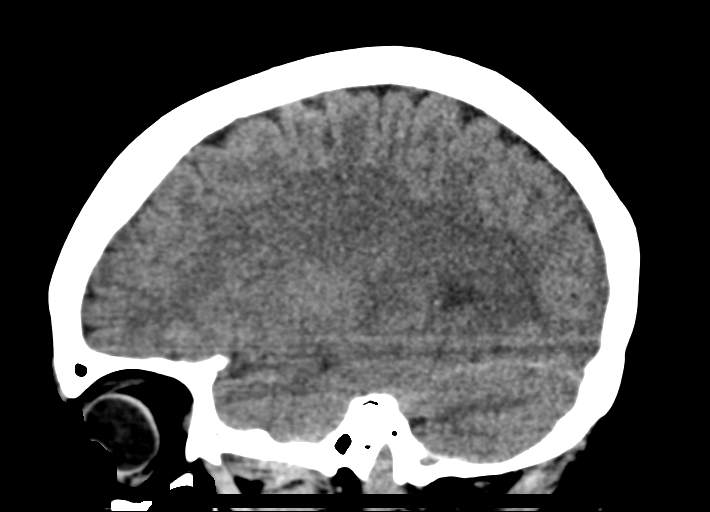
[im 26/52  brain]
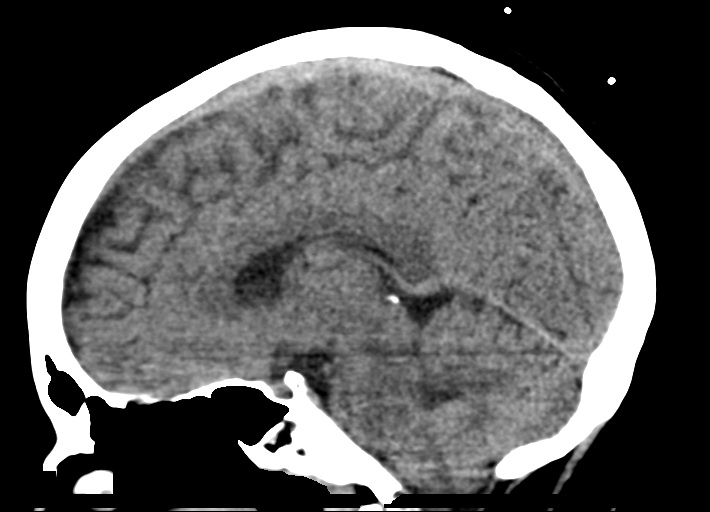
[im 35/52  brain]
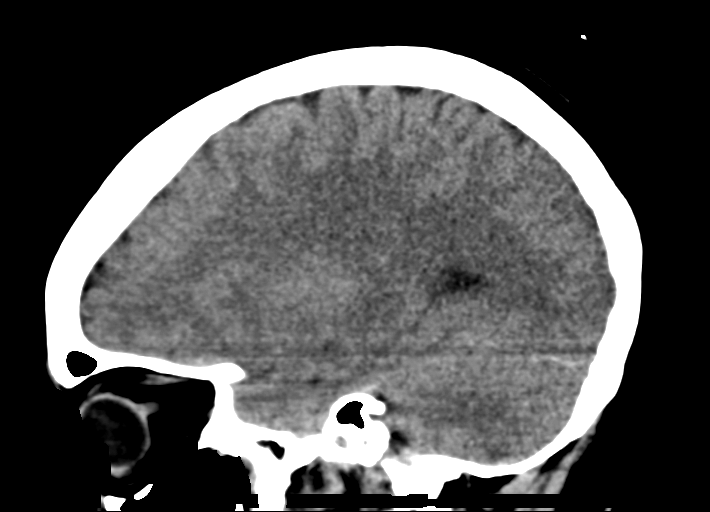

[15 of 47 positions shown; findings below may reference images not displayed]

FINDINGS: Brain: Normal ventricular morphology. No midline shift or mass
effect. Normal appearance of brain parenchyma. No intracranial
hemorrhage, mass lesion, evidence of acute infarction, or
extra-axial fluid collection.

Vascular: No hyperdense vessels

Skull: Intact

Sinuses/Orbits: Clear

Other: N/A
IMPRESSION: Normal exam.

## 2022-07-09 IMAGING — MR MR HEAD W/O CM
6 of 10 series · 29 of 48 positions shown · non-contrast
Comparison: None.

CLINICAL DATA: Neuro deficit, stroke suspected

EXAM:
MRI HEAD WITHOUT CONTRAST
TECHNIQUE: Multiplanar, multiecho pulse sequences of the brain and surrounding
structures were obtained without intravenous contrast.

[Series 2: DWI · axial · 3.0mm · 0.94mm/px · z∈[-109,+36]mm · 9 of 100 slices shown (1 of 2)]
[im 1/100]
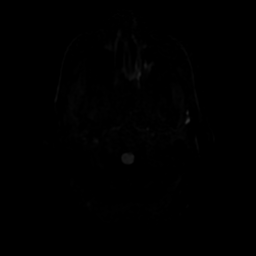
[im 13/100]
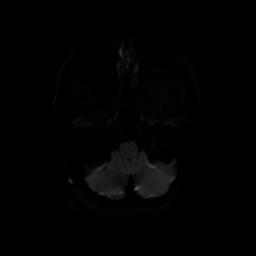
[im 25/100]
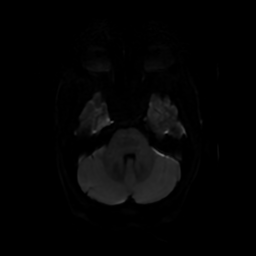
[im 38/100]
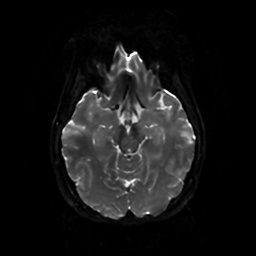
[im 50/100]
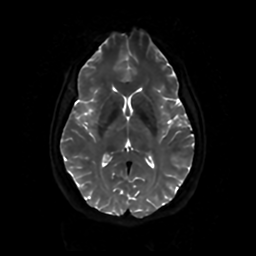
[im 62/100]
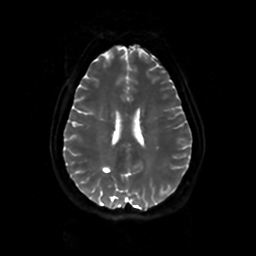
[im 75/100]
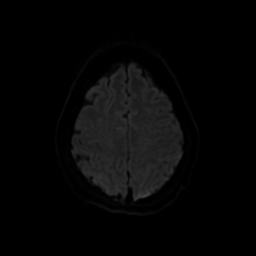
[im 87/100]
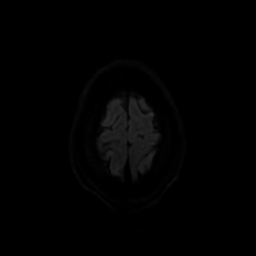
[im 100/100]
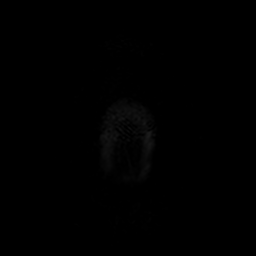

[Series 3: DWI · coronal · 4.0mm · 0.94mm/px · 7 of 76 slices shown (2 of 2)]
[im 1/76]
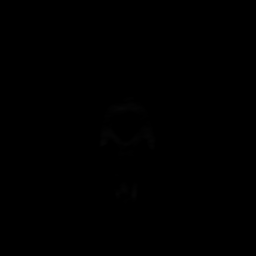
[im 13/76]
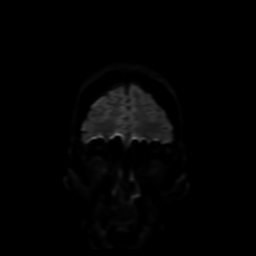
[im 26/76]
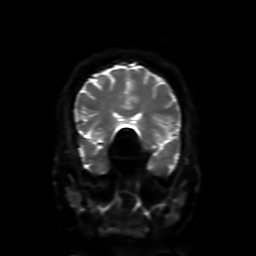
[im 38/76]
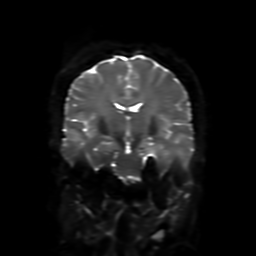
[im 51/76]
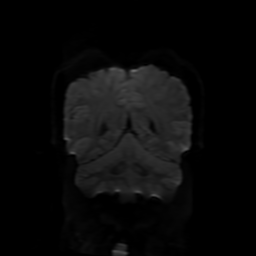
[im 63/76]
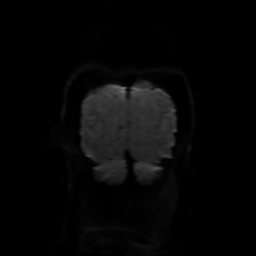
[im 76/76]
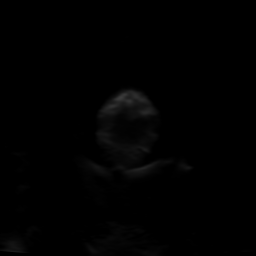

[Series 4: FLAIR · sagittal · 5.0mm · 0.23mm/px · 2 of 23 slices shown (1 of 2)]
[im 1/23]
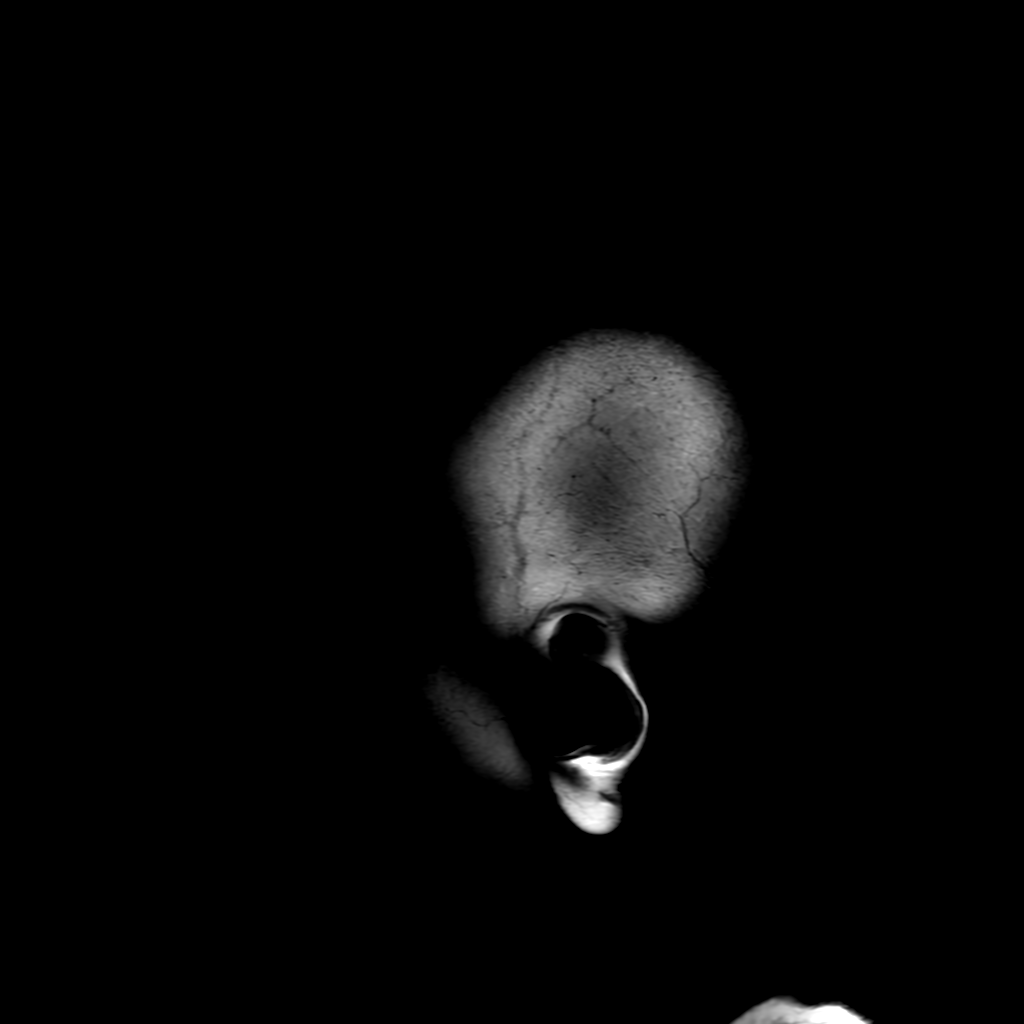
[im 23/23]
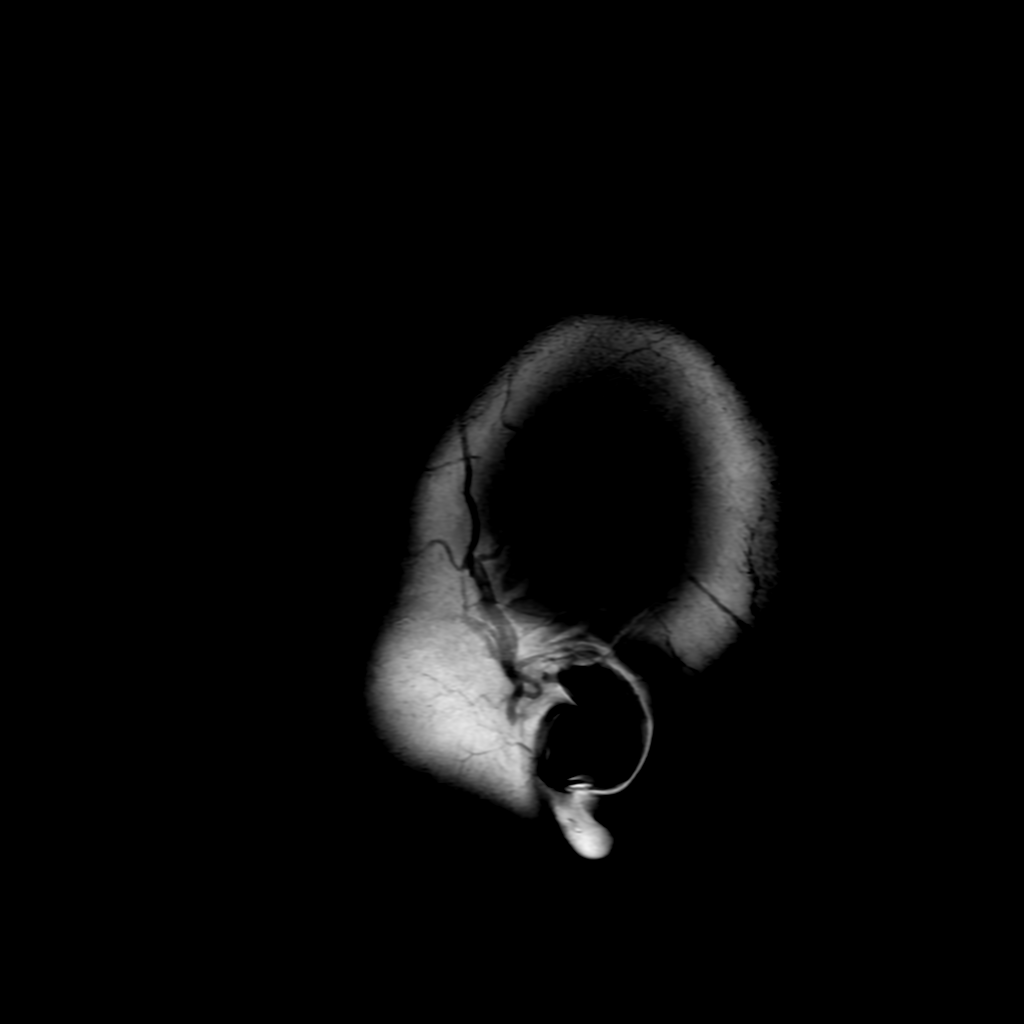

[Series 6: FLAIR · axial · 4.0mm · 0.45mm/px · z∈[-107,+36]mm · 3 of 34 slices shown (2 of 2)]
[im 1/34]
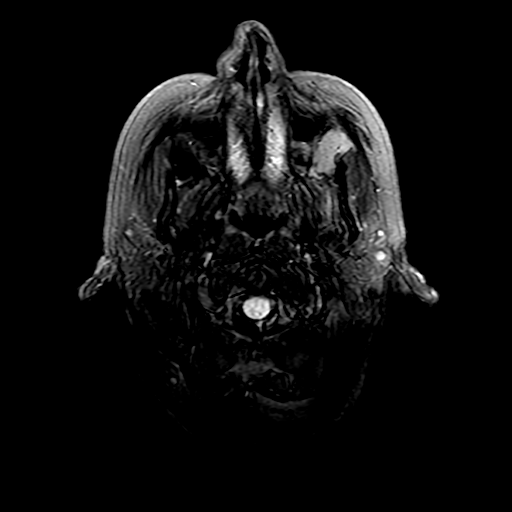
[im 17/34]
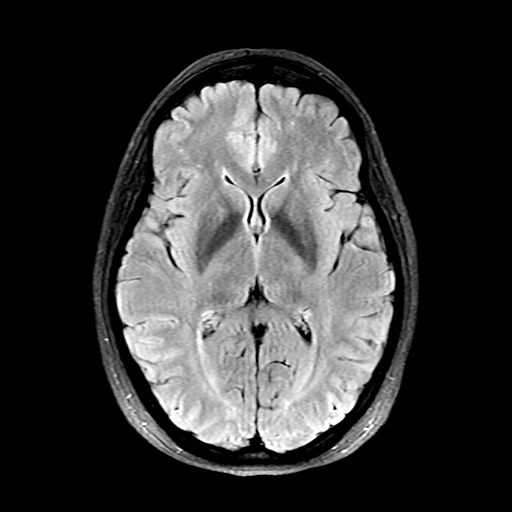
[im 34/34]
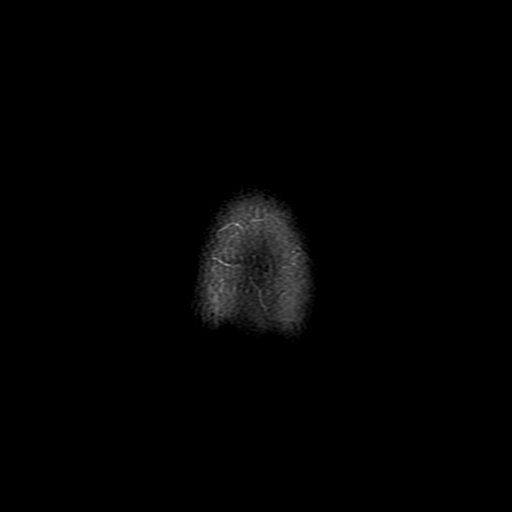

[Series 250: ADC · axial · 3.0mm · 0.94mm/px · z∈[-109,+36]mm · 5 of 50 slices shown (1 of 2)]
[im 1/50]
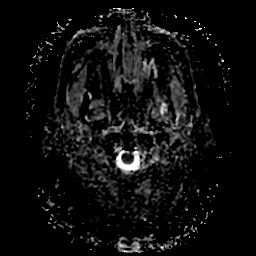
[im 13/50]
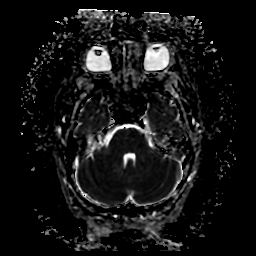
[im 25/50]
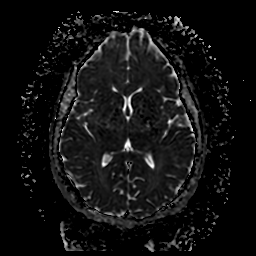
[im 37/50]
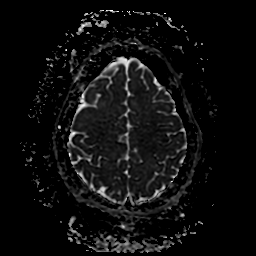
[im 50/50]
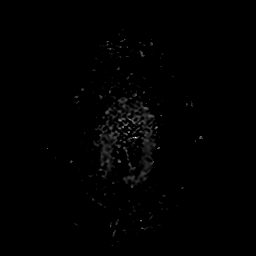

[Series 350: ADC · coronal · 4.0mm · 0.94mm/px · 3 of 38 slices shown (2 of 2)]
[im 1/38]
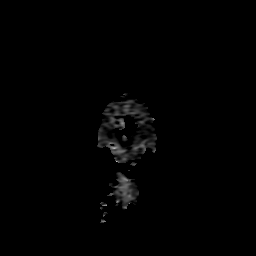
[im 19/38]
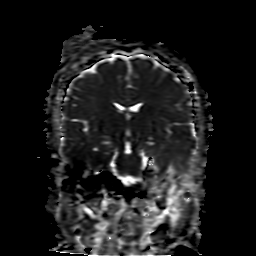
[im 38/38]
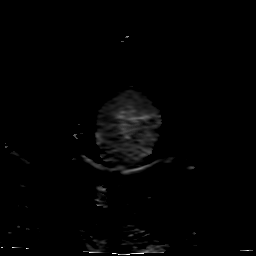

[29 of 48 positions shown; findings below may reference images not displayed]

FINDINGS: Brain: No restricted diffusion to suggest acute or subacute infarct.
No acute hemorrhage, mass, mass effect, or midline shift. No
hydrocephalus or extra-axial collection. Possible remote lacunar
infarct in the right parietal white matter (series 6, image 21) and
posterior left frontal white matter (series 6, image 22). No
hemosiderin deposition to suggest remote hemorrhage. Punctate foci
of T2 hyperintense signal in the cerebral white matter (series 6,
image 17 and 18 for example) and left cerebellar hemisphere (series
6, images 6 and 9).

Vascular: Normal flow voids.

Skull and upper cervical spine: Normal marrow signal.

Sinuses/Orbits: Negative.

Other: The mastoids are well aerated.
IMPRESSION: 1. No evidence of acute or subacute infarction.
2. Punctate foci of T2 hyperintense signal in the cerebral and
cerebellar hemispheres, which are nonspecific but can be seen in the
setting of chronic small vessel ischemic changes and chronic
migraines, although these tend to be limited to the cerebral
hemispheres. These can also be seen in the setting of prior
traumatic brain injury, as well as with demyelinating disease, such
as multiple sclerosis, although these are not in a pattern typical
of multiple sclerosis. Correlate with symptoms and history.

## 2022-08-01 ENCOUNTER — Encounter: Payer: Self-pay | Admitting: Obstetrics and Gynecology

## 2022-08-01 ENCOUNTER — Ambulatory Visit (INDEPENDENT_AMBULATORY_CARE_PROVIDER_SITE_OTHER): Payer: 59 | Admitting: Obstetrics and Gynecology

## 2022-08-01 VITALS — BP 129/83 | HR 66 | Ht 62.0 in | Wt 183.0 lb

## 2022-08-01 DIAGNOSIS — N946 Dysmenorrhea, unspecified: Secondary | ICD-10-CM | POA: Diagnosis not present

## 2022-08-01 DIAGNOSIS — N920 Excessive and frequent menstruation with regular cycle: Secondary | ICD-10-CM

## 2022-08-01 NOTE — Progress Notes (Signed)
   RETURN GYNECOLOGY VISIT  Subjective:  Mallory Byrd is a 42 y.o. [id number] with LMP 07/17/22 presenting for discussion of hysterectomy  Pt has longstanding history of painful, heavy periods. Cycles are regular, last 4 days. She uses the largest menstrual cup available but can still bleed through it. She was planned for a TVH with Dr. Para March on 05/07/22, but needed to cancel because she wasn't able to take time off of work. She does not want hormonal management and desires definitive solution for her periods.   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 No hx STI/PID Soc: Denies t/d. Occasional alcohol (<1x/wk)  Objective:   Vitals:   08/01/22 0847  BP: 129/83  Pulse: 66  Weight: 183 lb (83 kg)  Height: 5\' 2"  (1.575 m)   General:  Alert, oriented and cooperative. Patient is in no acute distress.  Skin: Skin is warm and dry. No rash noted.   Cardiovascular: Normal heart rate noted  Respiratory: Normal respiratory effort, no problems with respiration noted  Abdomen: Soft, non-tender, non-distended    Assessment and Plan:  Mallory Byrd is a 42 y.o. with dysmenorrhea and menorrhagia with regular cycle  Dysmenorrhea Menorrhagia with regular cycle - Diagnosis: dysmenorrhea, menorrhagia with regular cycle - Planned surgery: TVH/BS - Risks of surgery include but are not limited to:  Bleeding - Can bleed enough to need transfusion or need for additional surgeries I.e. conversation to open surgery.  Infection - The vagina can develop cuff infection that requires more procedures or hospitalization for antibiotics Injury to  surrounding organs/tissues (i.e. bowel/bladder/ureters) - discussed how each of these is managed I.e. bladder injury requires catheter for 10-14 days after surgery Need for additional procedures - Would be specific to a complication or injury Wound complications  Hospital re-admission - In the event of a delayed complication being recognized I.e. ureteral injury discussed Conversion to open surgery - reviewed some complications may necessitate or it may be the only way to complete the surgery.   VTE - Discussed risk of blood clots following delivery - We discussed alternatives to hysterectomy including birth control options that will work better to regulate bleeding, I.e. IUD, endometrial ablation. She desires definitive management.  - We also discussed opportunistic salpingectomy. Reviewed that if tubes are not easily visualized they will not be removed. Discussed that ovaries will remain in place. - We discussed postop restrictions, precautions and expectations. Reviewed that she will be discharged POD0 unless she does not meet appropriate milestones - Preop testing needed: T&S/CBC day of surgery, remainder per anesthesia - All questions answered  Lennart Pall, MD

## 2022-08-22 ENCOUNTER — Telehealth: Payer: Self-pay

## 2022-08-22 ENCOUNTER — Encounter (HOSPITAL_BASED_OUTPATIENT_CLINIC_OR_DEPARTMENT_OTHER): Payer: Self-pay | Admitting: Obstetrics and Gynecology

## 2022-08-22 ENCOUNTER — Other Ambulatory Visit: Payer: Self-pay

## 2022-08-22 NOTE — Telephone Encounter (Signed)
Called patient to see if she was available for surgery on 09/04/22 or 10/15/22? Patient chose 09/04/22 @[facility]  w/ Dr. Berton Lan @9 :30 am. Provided patient w/ date, time, location and pre-op instructions.

## 2022-08-22 NOTE — Telephone Encounter (Signed)
Patient called on 08/20/22 to schedule procedure with Dr. Berton Lan.  Let the patient know that would call her once I was able to confirm the dates available in June. Pt was hoping to be scheduled between 09/03/22-09/11/22.

## 2022-08-22 NOTE — Progress Notes (Signed)
Spoke w/ via phone for pre-op interview---[facility] needs dos----urine pregnancy per anesthesia, surgeon orders pending as of 08/22/22               Lab results------09/02/2022 lab appt for cbc, type & screen COVID test -----patient states asymptomatic no test needed Arrive at -------0730 on Wednesday, 09/04/2022 NPO after MN NO Solid Food.  Clear liquids from MN until---0630 Med rec completed Medications to take morning of surgery -----Omeprazole prn Diabetic medication -----n/a Patient instructed no nail polish to be worn day of surgery Patient instructed to bring photo id and insurance card day of surgery Patient aware to have Driver (ride ) / caregiver    for 24 hours after surgery - mother, Lupita Leash Patient Special Instructions -----Extended stay instructions given. Pre-Op special Instructions -----Requested orders from Dr. Roma Kayser via Epic IB on 08/22/22. Patient verbalized understanding of instructions that were given at this phone interview. Patient denies shortness of breath, chest pain, fever, cough at this phone interview.

## 2022-08-22 NOTE — Progress Notes (Signed)
Your procedure is scheduled on Wednesday, 09/04/2022.  Report to [facility] AT 7:30 AM.   Call this number if you have problems the morning of surgery  [phone].   OUR ADDRESS IS [address].  WE ARE LOCATED IN [facility].  PLEASE BRING YOUR INSURANCE CARD AND PHOTO ID DAY OF SURGERY.  ONLY 2 PEOPLE ARE ALLOWED IN  WAITING  ROOM                                      REMEMBER:  DO NOT EAT FOOD, CANDY GUM OR MINTS  AFTER MIDNIGHT THE NIGHT BEFORE YOUR SURGERY . YOU MAY HAVE CLEAR LIQUIDS FROM MIDNIGHT THE NIGHT BEFORE YOUR SURGERY UNTIL  6:30 AM. NO CLEAR LIQUIDS AFTER   6:30 AM DAY OF SURGERY.  YOU MAY  BRUSH YOUR TEETH MORNING OF SURGERY AND RINSE YOUR MOUTH OUT, NO CHEWING GUM CANDY OR MINTS.     CLEAR LIQUID DIET    Allowed      Water                                                                   Coffee and tea, regular and decaf  (NO cream or milk products of any type, may sweeten)                         Carbonated beverages, regular and diet                                    Sports drinks like Gatorade _____________________________________________________________________     TAKE ONLY THESE MEDICATIONS MORNING OF SURGERY: Omeprazole if needed                                       DO NOT WEAR JEWERLY/  METAL/  PIERCINGS (INCLUDING NO PLASTIC PIERCINGS) DO NOT WEAR LOTIONS, POWDERS, PERFUMES OR NAIL POLISH ON YOUR FINGERNAILS. TOENAIL POLISH IS OK TO WEAR. DO NOT SHAVE FOR 48 HOURS PRIOR TO DAY OF SURGERY.  CONTACTS, GLASSES, OR DENTURES MAY NOT BE WORN TO SURGERY.  REMEMBER: NO SMOKING, VAPING ,  DRUGS OR ALCOHOL FOR 24 HOURS BEFORE YOUR SURGERY.                                    [facility] IS NOT RESPONSIBLE  FOR ANY BELONGINGS.                                                                    Marland Kitchen           [place] - Preparing for Surgery Before surgery,  you can play an important role.  Because skin is  not sterile, your skin needs to be as free of germs as possible.  You can reduce the number of germs on your skin by washing with CHG (chlorahexidine gluconate) soap before surgery.  CHG is an antiseptic cleaner which kills germs and bonds with the skin to continue killing germs even after washing. Please DO NOT use if you have an allergy to CHG or antibacterial soaps.  If your skin becomes reddened/irritated stop using the CHG and inform your nurse when you arrive at Short Stay. Do not shave (including legs and underarms) for at least 48 hours prior to the first CHG shower.  You may shave your face/neck. Please follow these instructions carefully:  1.  Shower with CHG Soap the night before surgery and the  morning of Surgery.  2.  If you choose to wash your hair, wash your hair first as usual with your  normal  shampoo.  3.  After you shampoo, rinse your hair and body thoroughly to remove the  shampoo.                                        4.  Use CHG as you would any other liquid soap.  You can apply chg directly  to the skin and wash , chg soap provided, night before and morning of your surgery.  5.  Apply the CHG Soap to your body ONLY FROM THE NECK DOWN.   Do not use on face/ open                           Wound or open sores. Avoid contact with eyes, ears mouth and genitals (private parts).                       Wash face,  Genitals (private parts) with your normal soap.             6.  Wash thoroughly, paying special attention to the area where your surgery  will be performed.  7.  Thoroughly rinse your body with warm water from the neck down.  8.  DO NOT shower/wash with your normal soap after using and rinsing off  the CHG Soap.             9.  Pat yourself dry with a clean towel.            10.  Wear clean pajamas.            11.  Place clean sheets on your bed the night of your first shower and do not  sleep with pets. Day of Surgery : Do not apply any lotions/ powders the morning of  surgery.  Please wear clean clothes to the hospital/surgery center.  IF YOU HAVE ANY SKIN IRRITATION OR PROBLEMS WITH THE SURGICAL SOAP, PLEASE GET A BAR OF GOLD DIAL SOAP AND SHOWER THE NIGHT BEFORE YOUR SURGERY AND THE MORNING OF YOUR SURGERY. PLEASE LET THE NURSE KNOW MORNING OF YOUR SURGERY IF YOU HAD ANY PROBLEMS WITH THE SURGICAL SOAP.   YOUR SURGEON MAY HAVE REQUESTED EXTENDED RECOVERY TIME AFTER YOUR SURGERY. IT COULD BE A  JUST A FEW HOURS  UP TO AN OVERNIGHT STAY.  YOUR SURGEON SHOULD HAVE DISCUSSED THIS WITH YOU PRIOR TO YOUR SURGERY.  IN THE EVENT YOU NEED TO STAY OVERNIGHT PLEASE REFER TO THE FOLLOWING GUIDELINES. YOU MAY HAVE UP TO 4 VISITORS  MAY VISIT IN THE EXTENDED RECOVERY ROOM UNTIL 800 PM ONLY.  ONE  VISITOR AGE 91 AND OVER MAY SPEND THE NIGHT AND MUST BE IN EXTENDED RECOVERY ROOM NO LATER THAN 800 PM . YOUR DISCHARGE TIME AFTER YOU SPEND THE NIGHT IS 900 AM THE MORNING AFTER YOUR SURGERY. YOU MAY PACK A SMALL OVERNIGHT BAG WITH TOILETRIES FOR YOUR OVERNIGHT STAY IF YOU WISH.  REGARDLESS OF IF YOU STAY OVER NIGHT OR ARE DISCHARGED THE SAME DAY YOU WILL BE REQUIRED TO HAVE A RESPONSIBLE ADULT (18 YRS OLD OR OLDER) STAY WITH YOU FOR AT LEAST THE FIRST 24 HOURS  YOUR PRESCRIPTION MEDICATIONS WILL BE PROVIDED DURING YOUR HOSPITAL STAY.  ________________________________________________________________________                                                        QUESTIONS Mechele Claude PRE OP NURSE PHONE [phone].

## 2022-08-26 ENCOUNTER — Encounter (INDEPENDENT_AMBULATORY_CARE_PROVIDER_SITE_OTHER): Payer: Self-pay

## 2022-08-26 ENCOUNTER — Encounter: Payer: Self-pay | Admitting: Obstetrics and Gynecology

## 2022-08-30 ENCOUNTER — Telehealth: Payer: Self-pay

## 2022-08-30 ENCOUNTER — Encounter: Payer: Self-pay | Admitting: Obstetrics and Gynecology

## 2022-08-30 NOTE — Telephone Encounter (Signed)
Contacted patient to reschedule her 09/04/22 surgery. Advised that Dr. Berton Lan requested the best surgically assist for the safest outcome and the provider was available on 10/15/22. Patient stated she wasn't available on 10/15/22 and would contact Dr. Berton Lan for other alternatives. I advised that I was canceling the procedure and to call me back if her schedule opens for 10/15/22.

## 2022-08-30 NOTE — Progress Notes (Signed)
Called and left message for pt to let know her PAT appt for Monday 09-02-2022 has been cancelled since her surgery for 09-04-2022 has been cancelled @ [facility], left phone to call back for any questions.

## 2022-09-02 ENCOUNTER — Encounter (HOSPITAL_COMMUNITY): Payer: 59

## 2022-09-04 ENCOUNTER — Ambulatory Visit (HOSPITAL_BASED_OUTPATIENT_CLINIC_OR_DEPARTMENT_OTHER): Admission: RE | Admit: 2022-09-04 | Payer: 59 | Source: Home / Self Care | Admitting: Obstetrics and Gynecology

## 2022-09-04 DIAGNOSIS — Z01818 Encounter for other preprocedural examination: Secondary | ICD-10-CM

## 2022-09-04 HISTORY — DX: Other specified postprocedural states: Z98.890

## 2022-09-04 HISTORY — DX: Other complications of anesthesia, initial encounter: T88.59XA

## 2022-09-04 HISTORY — DX: Family history of other specified conditions: Z84.89

## 2022-09-04 HISTORY — DX: Polycystic ovarian syndrome: E28.2

## 2022-09-04 HISTORY — DX: Gastro-esophageal reflux disease without esophagitis: K21.9

## 2022-09-04 SURGERY — HYSTERECTOMY, VAGINAL
Anesthesia: Choice
